# Patient Record
Sex: Female | Born: 1995 | Race: Black or African American | Marital: Single
Health system: Southern US, Community
[De-identification: ages and names within clinical notes are randomized; demographics above are authoritative.]

---
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
## 2016-03-17 ENCOUNTER — Ambulatory Visit
Admission: RE | Admit: 2016-03-17 | Discharge: 2016-03-17 | Disposition: A | Discharge status: Home / Self Care | Payer: BLUE CROSS/BLUE SHIELD | Source: Ambulatory Visit | Attending: Pediatrics | Admitting: Pediatrics

## 2016-03-17 ENCOUNTER — Other Ambulatory Visit: Payer: Self-pay | Admitting: Pediatrics

## 2016-03-17 DIAGNOSIS — R059 Cough, unspecified: Secondary | ICD-10-CM

## 2016-03-17 DIAGNOSIS — R05 Cough: Secondary | ICD-10-CM

## 2016-11-15 IMAGING — CR DG CHEST 2V
2 series · 2 of 2 positions shown · non-contrast
Comparison: 02/05/2012

CLINICAL DATA: Persistent cough for 3 weeks

EXAM:
CHEST  2 VIEW

[w chest pa]
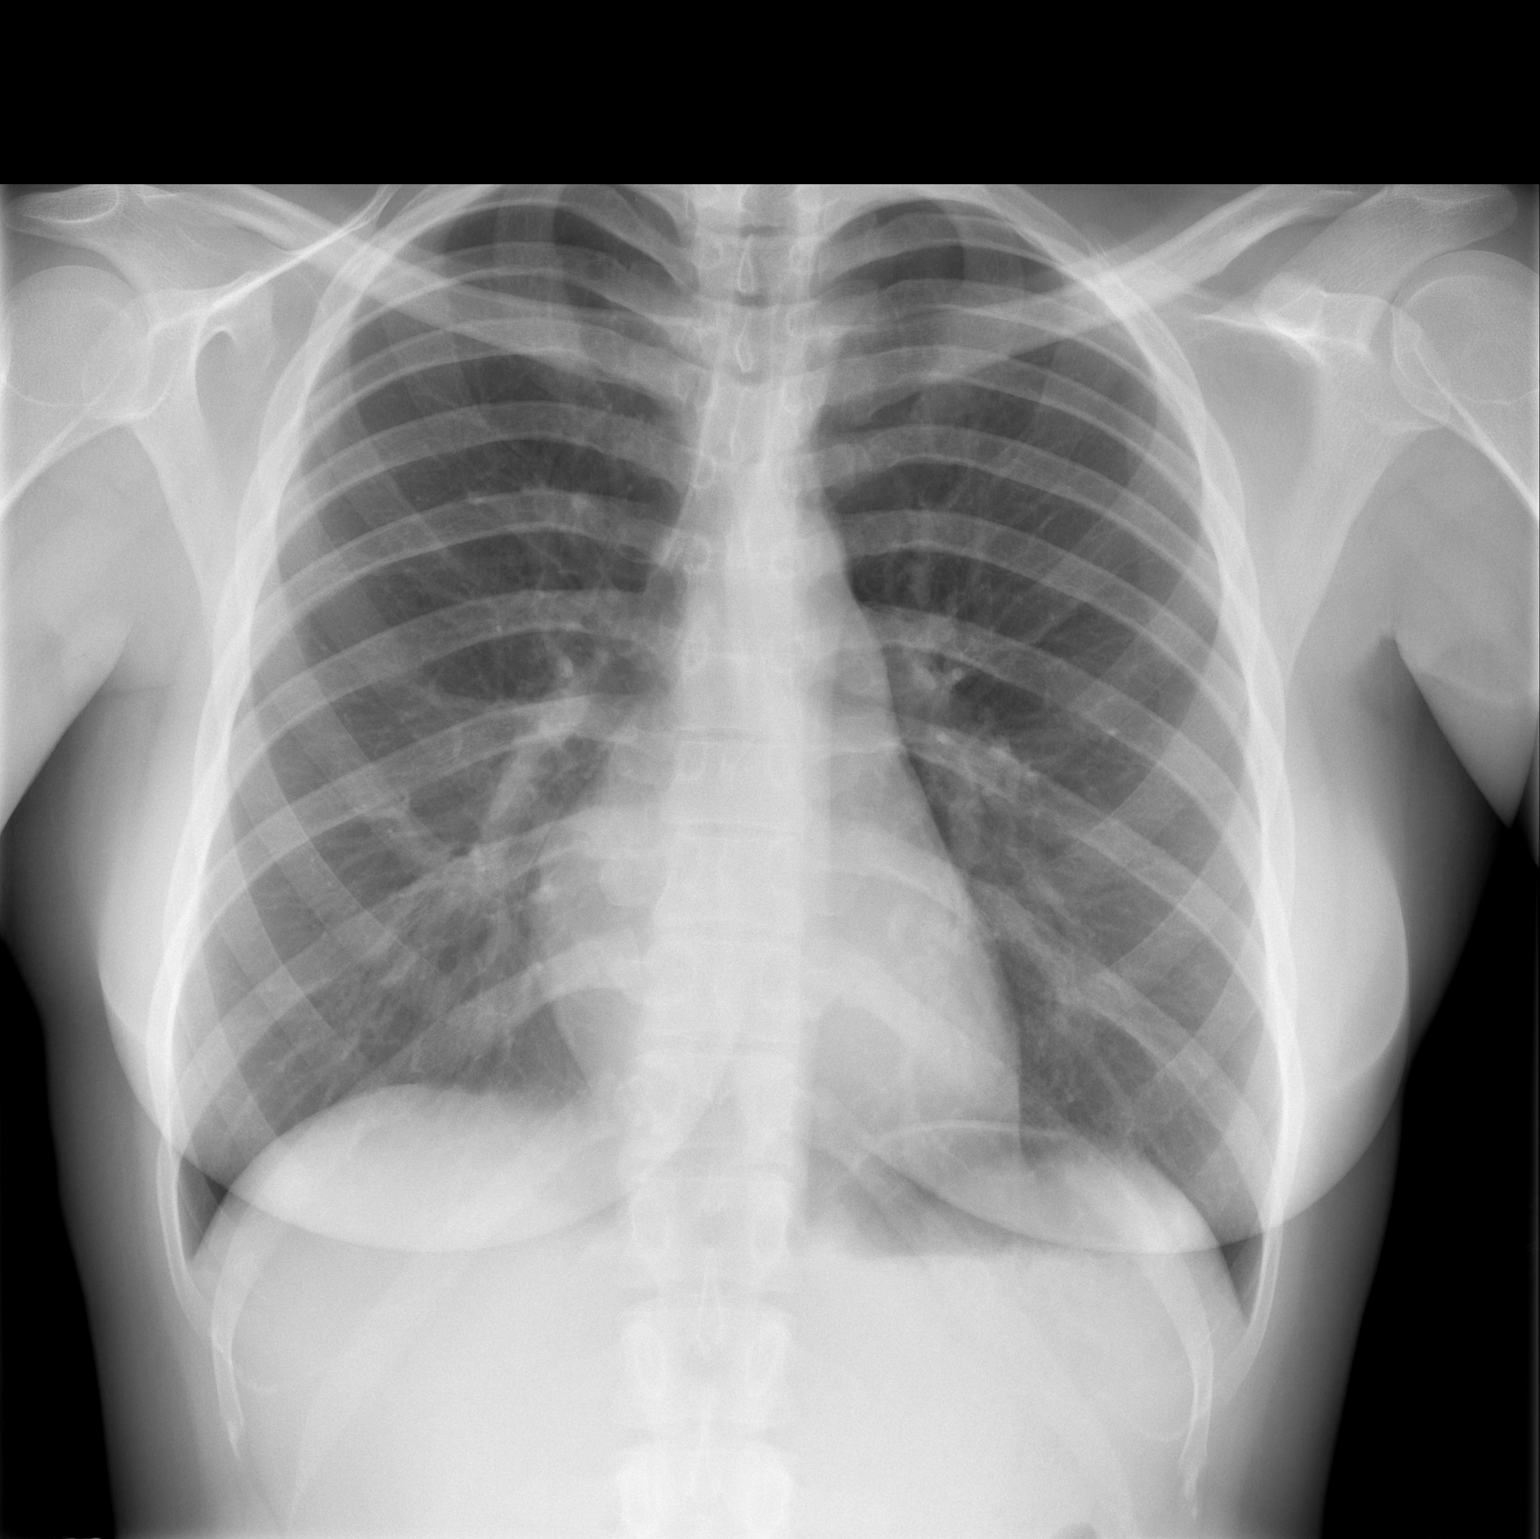

[w chest lat]
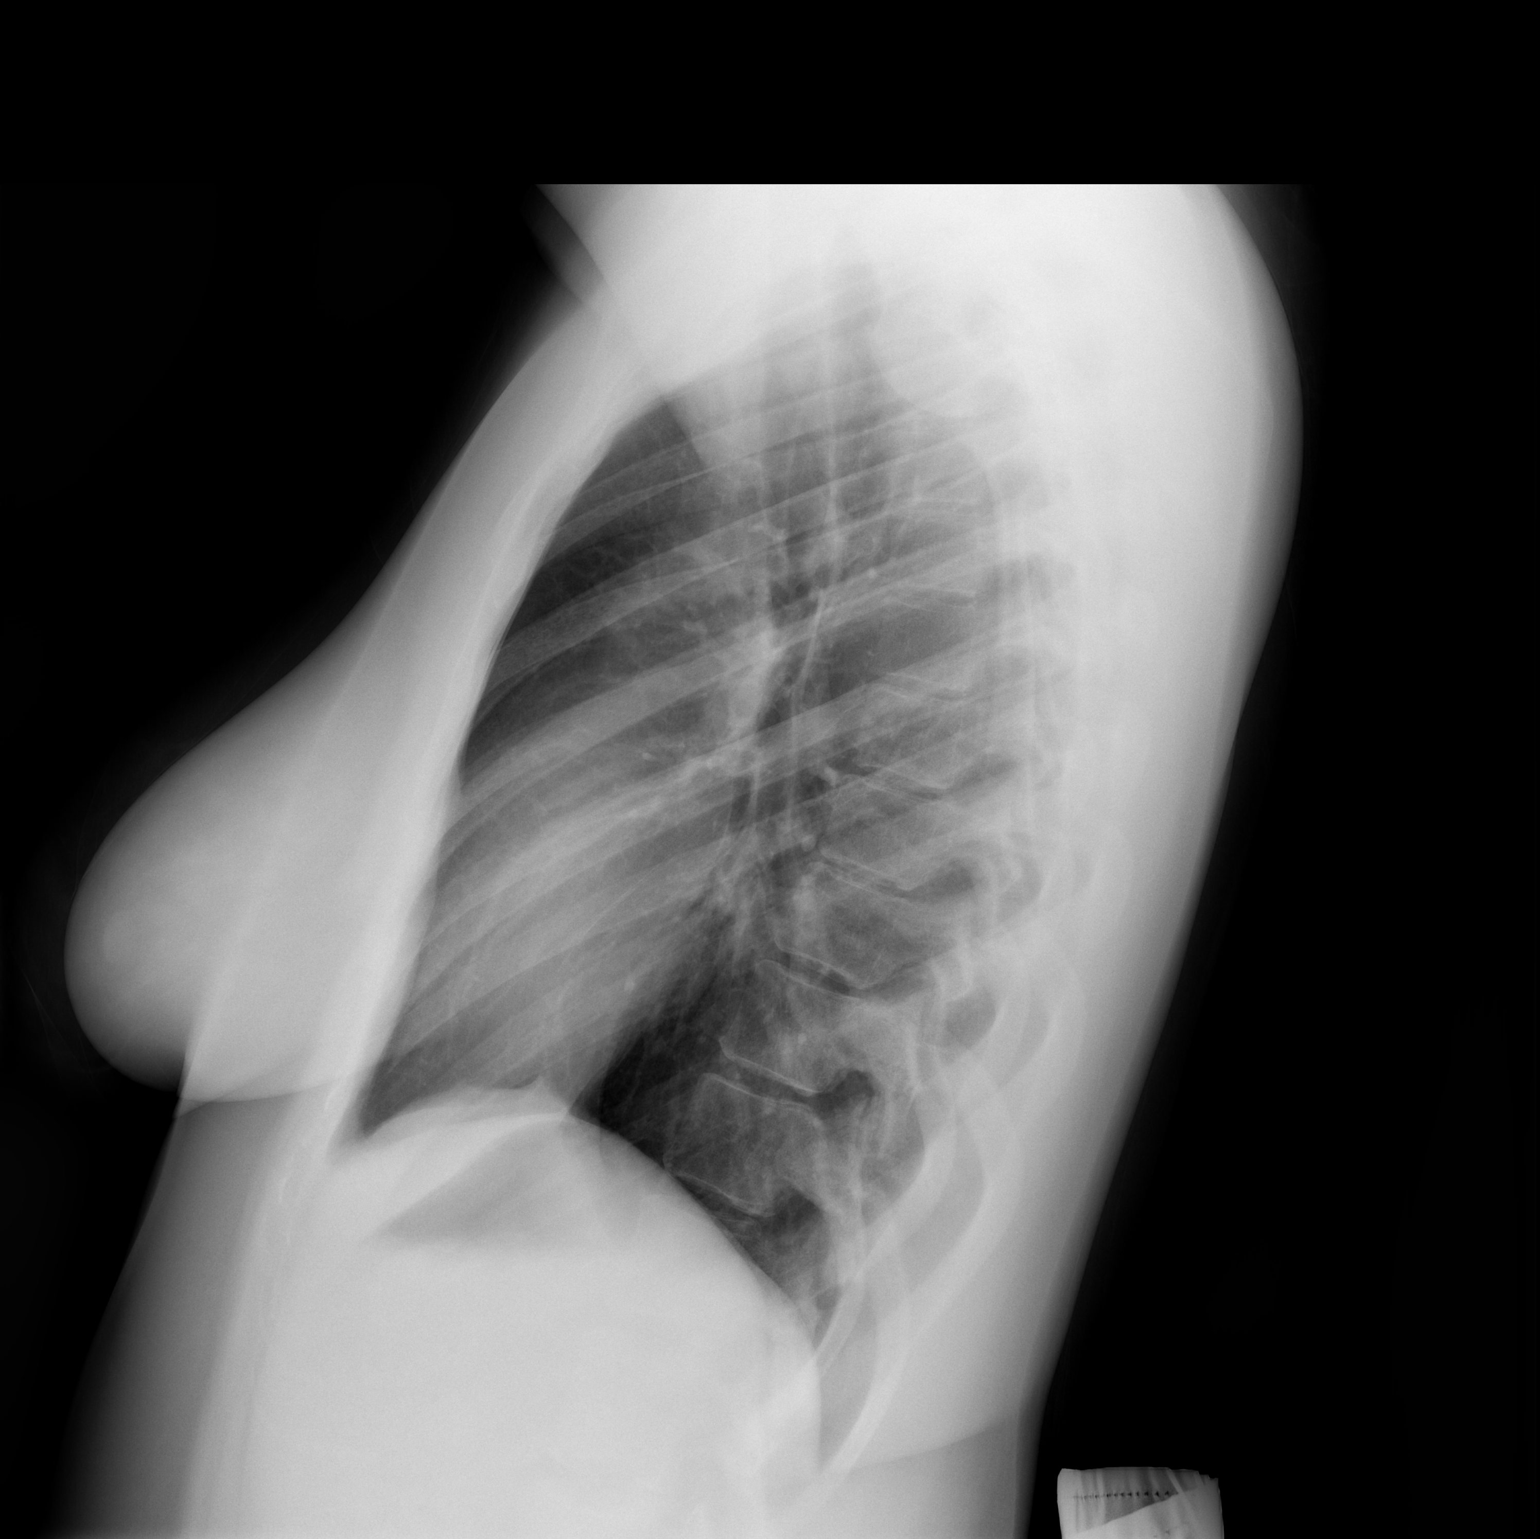

[2 of 2 positions shown; findings below may reference images not displayed]

FINDINGS: The heart size and mediastinal contours are within normal limits.
Both lungs are clear. The visualized skeletal structures are
unremarkable.
IMPRESSION: No active cardiopulmonary disease.

## 2021-07-24 ENCOUNTER — Other Ambulatory Visit: Payer: Self-pay

## 2021-07-24 ENCOUNTER — Emergency Department (HOSPITAL_COMMUNITY)
Admission: EM | Admit: 2021-07-24 | Discharge: 2021-07-24 | Disposition: A | Discharge status: Home / Self Care | Payer: BLUE CROSS/BLUE SHIELD | Attending: Physician Assistant | Admitting: Physician Assistant

## 2021-07-24 ENCOUNTER — Encounter (HOSPITAL_COMMUNITY): Payer: Self-pay | Admitting: *Deleted

## 2021-07-24 DIAGNOSIS — K0889 Other specified disorders of teeth and supporting structures: Secondary | ICD-10-CM

## 2021-07-24 DIAGNOSIS — K029 Dental caries, unspecified: Secondary | ICD-10-CM | POA: Insufficient documentation

## 2021-07-24 MED ORDER — PENICILLIN V POTASSIUM 500 MG PO TABS: 500.0000 mg | ORAL_TABLET | Freq: Four times a day (QID) | ORAL | 0 refills | Status: DC

## 2021-07-24 MED ORDER — TRAMADOL HCL 50 MG PO TABS: 50.0000 mg | ORAL_TABLET | Freq: Two times a day (BID) | ORAL | 0 refills | Status: AC

## 2021-07-24 MED ORDER — PENICILLIN V POTASSIUM 500 MG PO TABS: 500.0000 mg | ORAL_TABLET | Freq: Four times a day (QID) | ORAL | 0 refills | Status: AC

## 2021-07-24 MED ORDER — NAPROXEN 500 MG PO TABS: 500.0000 mg | ORAL_TABLET | Freq: Two times a day (BID) | ORAL | 0 refills | Status: AC

## 2021-07-24 NOTE — ED Triage Notes (Signed)
Toothache for 2 weeks.   

## 2021-07-24 NOTE — Discharge Instructions (Addendum)
You were given a prescription for antibiotics. Please take the antibiotic prescription fully.   Prescription given for Tramadol. Take medication as directed and do not operate machinery, drive a car, or work while taking this medication as it can make you drowsy.    Please follow-up with a dentist in the next 5 to 7 days for reevaluation.  If you do not have a dentist, resources were provided for dentist in the area in your discharge summary.  Please contact one of the offices that are listed and make an appointment for follow-up.  Please return to the emergency department for any new or worsening symptoms.

## 2021-07-24 NOTE — ED Provider Notes (Signed)
[facility] EMERGENCY DEPARTMENT Provider Note   CSN: [id number] Arrival date & time: 07/24/21  1811     History Chief Complaint  Patient presents with   Dental Pain    Lorraine Reed is a 25 y.o. female.  HPI   Pt is a 25 y/o female who presents to the ED today for eval of right lower dental pain. C/o pain to the right lower molar and has a known cavity. She states she has an appt with her oral surgeon coming up but her pain has acutely worsened in the last 24 hours. She took tramadol at home and has had some relief. There have been no reported fevers  History reviewed. No pertinent past medical history.  There are no problems to display for this patient.   History reviewed. No pertinent surgical history.   OB History   No obstetric history on file.     No family history on file.  Social History   Substance Use Topics   Alcohol use: Never   Drug use: Never    Home Medications Prior to Admission medications   Medication Sig Start Date End Date Taking? Authorizing Provider  naproxen (NAPROSYN) 500 MG tablet Take 1 tablet (500 mg total) by mouth 2 (two) times daily for 7 days. 07/24/21 07/31/21 Yes Keyron Pokorski S, PA-C  penicillin v potassium (VEETID) 500 MG tablet Take 1 tablet (500 mg total) by mouth 4 (four) times daily for 7 days. 07/24/21 07/31/21 Yes Amaru Burroughs S, PA-C  traMADol (ULTRAM) 50 MG tablet Take 1 tablet (50 mg total) by mouth 2 (two) times daily. 07/24/21  Yes Rithik Odea S, PA-C    Allergies    Patient has no known allergies.  Review of Systems   Review of Systems  Constitutional:  Negative for fever.  HENT:  Positive for dental problem.    Physical Exam Updated Vital Signs BP 111/87 (BP Location: Right Arm)   Pulse 73   Temp 99.1 F (37.3 C)   Resp 14   Ht 6\' 3"  (1.905 m)   Wt 100.2 kg   LMP 07/10/2021   SpO2 99%   BMI 27.62 kg/m   Physical Exam Vitals and nursing note reviewed.  Constitutional:       General: She is not in acute distress.    Appearance: She is well-developed.  HENT:     Head: Normocephalic and atraumatic.     Mouth/Throat:   Eyes:     Conjunctiva/sclera: Conjunctivae normal.  Cardiovascular:     Rate and Rhythm: Normal rate.  Pulmonary:     Effort: Pulmonary effort is normal.  Musculoskeletal:        General: Normal range of motion.     Cervical back: Neck supple.  Skin:    General: Skin is warm and dry.  Neurological:     Mental Status: She is alert.    ED Results / Procedures / Treatments   Labs (all labs ordered are listed, but only abnormal results are displayed) Labs Reviewed - No data to display  EKG None  Radiology No results found.  Procedures Procedures   Medications Ordered in ED Medications - No data to display  ED Course  I have reviewed the triage vital signs and the nursing notes.  Pertinent labs & imaging results that were available during my care of the patient were reviewed by me and considered in my medical decision making (see chart for details).    MDM Rules/Calculators/A&P  Patient with toothache.  No gross abscess.  Exam unconcerning for Ludwig's angina or spread of infection.  Will treat with penicillin and pain medicine.  Urged patient to follow-up with dentist.      Final Clinical Impression(s) / ED Diagnoses Final diagnoses:  Pain, dental    Rx / DC Orders ED Discharge Orders          Ordered    penicillin v potassium (VEETID) 500 MG tablet  4 times daily        07/24/21 1837    naproxen (NAPROSYN) 500 MG tablet  2 times daily        07/24/21 1837    traMADol (ULTRAM) 50 MG tablet  2 times daily        07/24/21 1837             Karrie Meres, PA-C 07/24/21 1838    Terrilee Files, MD 07/25/21 1102
